# Patient Record
Sex: Female | Born: 1965 | Race: White | Hispanic: No | Marital: Married | Smoking: Never smoker
Health system: Southern US, Community
[De-identification: ages and names within clinical notes are randomized; demographics above are authoritative.]

## PROBLEM LIST (undated history)

## (undated) DIAGNOSIS — R51 Headache: Secondary | ICD-10-CM

## (undated) DIAGNOSIS — D229 Melanocytic nevi, unspecified: Secondary | ICD-10-CM

## (undated) DIAGNOSIS — K5190 Ulcerative colitis, unspecified, without complications: Secondary | ICD-10-CM

## (undated) DIAGNOSIS — D649 Anemia, unspecified: Secondary | ICD-10-CM

## (undated) DIAGNOSIS — R519 Headache, unspecified: Secondary | ICD-10-CM

## (undated) DIAGNOSIS — K219 Gastro-esophageal reflux disease without esophagitis: Secondary | ICD-10-CM

## (undated) DIAGNOSIS — F419 Anxiety disorder, unspecified: Secondary | ICD-10-CM

## (undated) DIAGNOSIS — Z87442 Personal history of urinary calculi: Secondary | ICD-10-CM

## (undated) HISTORY — DX: Gastro-esophageal reflux disease without esophagitis: K21.9

## (undated) HISTORY — PX: OTHER SURGICAL HISTORY: SHX169

## (undated) HISTORY — PX: ABDOMINAL HYSTERECTOMY: SHX81

---
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
## 1998-04-13 ENCOUNTER — Other Ambulatory Visit: Admission: RE | Admit: 1998-04-13 | Discharge: 1998-04-13 | Payer: Self-pay | Admitting: Obstetrics and Gynecology

## 1998-10-05 ENCOUNTER — Other Ambulatory Visit: Admission: RE | Admit: 1998-10-05 | Discharge: 1998-10-05 | Payer: Self-pay | Admitting: Obstetrics and Gynecology

## 1999-04-19 ENCOUNTER — Other Ambulatory Visit: Admission: RE | Admit: 1999-04-19 | Discharge: 1999-04-19 | Payer: Self-pay | Admitting: Obstetrics and Gynecology

## 1999-11-21 ENCOUNTER — Other Ambulatory Visit: Admission: RE | Admit: 1999-11-21 | Discharge: 1999-11-21 | Payer: Self-pay | Admitting: Obstetrics and Gynecology

## 1999-12-12 ENCOUNTER — Encounter: Payer: Self-pay | Admitting: Obstetrics and Gynecology

## 1999-12-14 ENCOUNTER — Ambulatory Visit (HOSPITAL_COMMUNITY): Admission: RE | Admit: 1999-12-14 | Discharge: 1999-12-14 | Payer: Self-pay | Admitting: Obstetrics and Gynecology

## 2000-05-30 ENCOUNTER — Other Ambulatory Visit: Admission: RE | Admit: 2000-05-30 | Discharge: 2000-05-30 | Payer: Self-pay | Admitting: Obstetrics and Gynecology

## 2000-11-13 ENCOUNTER — Other Ambulatory Visit: Admission: RE | Admit: 2000-11-13 | Discharge: 2000-11-13 | Payer: Self-pay | Admitting: Obstetrics and Gynecology

## 2001-05-29 ENCOUNTER — Other Ambulatory Visit: Admission: RE | Admit: 2001-05-29 | Discharge: 2001-05-29 | Payer: Self-pay | Admitting: Obstetrics and Gynecology

## 2001-12-25 ENCOUNTER — Other Ambulatory Visit: Admission: RE | Admit: 2001-12-25 | Discharge: 2001-12-25 | Payer: Self-pay | Admitting: Obstetrics and Gynecology

## 2002-09-09 ENCOUNTER — Other Ambulatory Visit: Admission: RE | Admit: 2002-09-09 | Discharge: 2002-09-09 | Payer: Self-pay | Admitting: Obstetrics and Gynecology

## 2003-04-08 ENCOUNTER — Other Ambulatory Visit: Admission: RE | Admit: 2003-04-08 | Discharge: 2003-04-08 | Payer: Self-pay | Admitting: Obstetrics and Gynecology

## 2003-05-31 ENCOUNTER — Emergency Department (HOSPITAL_COMMUNITY): Admission: EM | Admit: 2003-05-31 | Discharge: 2003-06-01 | Payer: Self-pay | Admitting: Emergency Medicine

## 2003-06-01 ENCOUNTER — Encounter: Payer: Self-pay | Admitting: Emergency Medicine

## 2003-12-31 ENCOUNTER — Other Ambulatory Visit: Admission: RE | Admit: 2003-12-31 | Discharge: 2003-12-31 | Payer: Self-pay | Admitting: Obstetrics and Gynecology

## 2004-11-17 ENCOUNTER — Other Ambulatory Visit: Admission: RE | Admit: 2004-11-17 | Discharge: 2004-11-17 | Payer: Self-pay | Admitting: Obstetrics and Gynecology

## 2005-08-28 ENCOUNTER — Other Ambulatory Visit: Admission: RE | Admit: 2005-08-28 | Discharge: 2005-08-28 | Payer: Self-pay | Admitting: Obstetrics and Gynecology

## 2006-07-04 ENCOUNTER — Encounter (INDEPENDENT_AMBULATORY_CARE_PROVIDER_SITE_OTHER): Payer: Self-pay | Admitting: *Deleted

## 2006-07-04 ENCOUNTER — Inpatient Hospital Stay (HOSPITAL_COMMUNITY): Admission: RE | Admit: 2006-07-04 | Discharge: 2006-07-07 | Payer: Self-pay | Admitting: Obstetrics and Gynecology

## 2008-10-22 ENCOUNTER — Emergency Department (HOSPITAL_COMMUNITY): Admission: EM | Admit: 2008-10-22 | Discharge: 2008-10-22 | Payer: Self-pay | Admitting: Emergency Medicine

## 2010-03-21 ENCOUNTER — Encounter: Admission: RE | Admit: 2010-03-21 | Discharge: 2010-03-21 | Payer: Self-pay | Admitting: Family Medicine

## 2010-03-29 ENCOUNTER — Encounter: Admission: RE | Admit: 2010-03-29 | Discharge: 2010-03-29 | Payer: Self-pay | Admitting: Gastroenterology

## 2010-10-08 ENCOUNTER — Encounter: Payer: Self-pay | Admitting: Gastroenterology

## 2011-01-02 LAB — CBC
MCHC: 33.6 g/dL (ref 30.0–36.0)
MCV: 79.9 fL (ref 78.0–100.0)
Platelets: 297 10*3/uL (ref 150–400)
RBC: 4.5 MIL/uL (ref 3.87–5.11)
RDW: 14.6 % (ref 11.5–15.5)

## 2011-01-02 LAB — POCT PREGNANCY, URINE: Preg Test, Ur: NEGATIVE

## 2011-01-02 LAB — DIFFERENTIAL
Basophils Absolute: 0 10*3/uL (ref 0.0–0.1)
Basophils Relative: 0 % (ref 0–1)
Eosinophils Absolute: 0 10*3/uL (ref 0.0–0.7)
Neutro Abs: 8.4 10*3/uL — ABNORMAL HIGH (ref 1.7–7.7)
Neutrophils Relative %: 88 % — ABNORMAL HIGH (ref 43–77)

## 2011-01-02 LAB — URINE CULTURE: Colony Count: NO GROWTH

## 2011-01-02 LAB — URINALYSIS, ROUTINE W REFLEX MICROSCOPIC
Glucose, UA: NEGATIVE mg/dL
Specific Gravity, Urine: 1.036 — ABNORMAL HIGH (ref 1.005–1.030)
Urobilinogen, UA: 1 mg/dL (ref 0.0–1.0)

## 2011-01-02 LAB — HEPATIC FUNCTION PANEL
ALT: 11 U/L (ref 0–35)
AST: 22 U/L (ref 0–37)
Alkaline Phosphatase: 58 U/L (ref 39–117)
Bilirubin, Direct: 0.1 mg/dL (ref 0.0–0.3)
Indirect Bilirubin: 0.5 mg/dL (ref 0.3–0.9)
Total Bilirubin: 0.6 mg/dL (ref 0.3–1.2)

## 2011-01-02 LAB — POCT I-STAT, CHEM 8
Chloride: 104 mEq/L (ref 96–112)
Glucose, Bld: 145 mg/dL — ABNORMAL HIGH (ref 70–99)
HCT: 40 % (ref 36.0–46.0)
Hemoglobin: 13.6 g/dL (ref 12.0–15.0)
Potassium: 3.7 mEq/L (ref 3.5–5.1)
Sodium: 140 mEq/L (ref 135–145)

## 2011-01-02 LAB — URINE MICROSCOPIC-ADD ON

## 2011-01-12 ENCOUNTER — Other Ambulatory Visit: Payer: Self-pay | Admitting: Internal Medicine

## 2011-01-12 DIAGNOSIS — E042 Nontoxic multinodular goiter: Secondary | ICD-10-CM

## 2011-01-17 ENCOUNTER — Ambulatory Visit
Admission: RE | Admit: 2011-01-17 | Discharge: 2011-01-17 | Disposition: A | Discharge status: Home / Self Care | Payer: BC Managed Care – PPO | Source: Ambulatory Visit | Attending: Internal Medicine | Admitting: Internal Medicine

## 2011-01-17 DIAGNOSIS — E042 Nontoxic multinodular goiter: Secondary | ICD-10-CM

## 2011-02-02 NOTE — Discharge Summary (Signed)
NAMESTEPHANIEANN, Kaitlin Parker              ACCOUNT NO.:  [id number]   MEDICAL RECORD NO.:  [id number]          PATIENT TYPE:  INP   LOCATION:  9310                          FACILITY:  [facility]   PHYSICIAN:  Miguel Aschoff, M.D.       DATE OF BIRTH:  [birth year]   DATE OF ADMISSION:  07/04/2006  DATE OF DISCHARGE:  07/07/2006                                 DISCHARGE SUMMARY   PREOPERATIVE DIAGNOSES:  1. Symptomatic uterine fibroids.  2. Menorrhagia.  3. Anemia.   POSTOPERATIVE DIAGNOSES:  1. Symptomatic uterine fibroids.  2. Menorrhagia.  3. Anemia.   OPERATIONS AND PROCEDURES:  Total abdominal hysterectomy.   BRIEF HISTORY:  The patient is a 45 year old white female with a history of  progressively heavier menses and who was noted on examination to have the  sudden onset of enlargement of the uterus with an exam approximately 18  months ago with a normal-sized uterus, now with the uterus being  approximately 14 weeks' size.  Due to the sudden onset of increased size of  the uterus associated with uterine fibroids, menorrhagia and anemia of 9.6,  the patient was admitted to the hospital to undergo a total abdominal  hysterectomy.  The risks and benefits of the procedure were discussed with  the patient.   HOSPITAL COURSE:  Preoperative studies were obtained.  The patient's initial  hemoglobin was 9.6.  Chemistry profile essentially within normal limits.  Urinalysis was negative.   Under general anesthesia, on July 04, 2006, a total abdominal  hysterectomy was carried out without difficulty.  Findings at the time of  surgery were limited to significant and diffuse enlargement of the uterus.  The tubes and ovaries appeared to be within normal limits.  The patient did  have a prior history of endometriosis.  No endometriosis was noted at the  time of the laparotomy.   Her abdominal exam under anesthesia intraoperatively was negative.  Hysterectomy was carried out without difficulty.  The  only complication  following surgery was a drop in her hemoglobin to 6.9.  the patient remained  stable and did not have any signs or symptoms of symptomatic anemia.  By the  third postoperative day was able to be discharged home.   MEDICATIONS FOR HOME:  1. Tylox one every 3 hours as needed for pain.  2. Niferex 150 iron tablets one or two daily.  3. The laxative of her choice.   She was instructed no heavy lifting, place nothing in the vagina, to call if  there were any problems such as fever, pain or heavy bleeding.  The patient  will be seen back in 4 weeks for a follow-up examination.  The final  pathology report revealed presence of a 9 cm benign leiomyoma of the uterus.  No other abnormalities were noted.      Miguel Aschoff, M.D.  Electronically Signed     AR/MEDQ  D:  07/14/2006  T:  07/15/2006  Job:  [id number]

## 2011-02-02 NOTE — Op Note (Signed)
Kaitlin Parker, Kaitlin Parker              ACCOUNT NO.:  [id number]   MEDICAL RECORD NO.:  [id number]          PATIENT TYPE:  INP   LOCATION:  9310                          FACILITY:  [facility]   PHYSICIAN:  Miguel Aschoff, M.D.       DATE OF BIRTH:  17-Feb-1966   DATE OF PROCEDURE:  07/04/2006  DATE OF DISCHARGE:                                 OPERATIVE REPORT   PREOPERATIVE DIAGNOSIS:  Uterine fibroids, menorrhagia.   POSTOPERATIVE DIAGNOSIS:  Uterine fibroids, menorrhagia.   PROCEDURE:  Total abdominal hysterectomy.   SURGEON:  Miguel Aschoff, M.D.   ASSISTANT:  Freddrick March. Tenny Craw, M.D.   ANESTHESIA:  General.   COMPLICATIONS:  None.   JUSTIFICATION:  The patient is a 45 year old white female with onset of  heavy menstrual cycles who, on examination, has been noted to have  significant enlargement of her uterus since her prior examination with her  uterus now being 14 weeks' equivalent in size.  Due to the degree of  bleeding and size of the uterine fibroids, she presents now to undergo  definitive therapy via total abdominal hysterectomy.  The patient also has a  history of endometriosis and if significant endometriosis is found, the  patient is to undergo bilateral salpingo-oophorectomy.  The risks and  benefits of the procedures were discussed with the patient.   DESCRIPTION OF PROCEDURE:  The patient was taken to the operating and placed  in the supine position.  General anesthesia was administered without  difficulty.  She was then placed in the supine position, prepped and draped  in the usual sterile fashion.  A Foley catheter was inserted. Once this was  done, a Pfannenstiel incision was made, extended down through the  subcutaneous tissue with bleeding points being clamped and coagulated as  they were encountered.  The fascia was then identified and incised  transversely. It was then separated from the underlying rectus muscles.  The  rectus muscles were divided in the midline.  The  peritoneum was then found  and entered carefully avoiding underlying structures.  The peritoneal  incision was then extended under direct visualization.  At this point, a  self-retaining retractor was placed through the wound.  The viscera were  packed out of the pelvis.  The uterus, again, was found to be globular,  approximately 14-16 weeks' equivalent in size.  It seemed the bulk of the  uterus was involved with one large myoma.  The tubes and ovaries appeared to  be within normal limits.  No abnormalities were noted on the cul-de-sac.  The appendix was visualized, was retrocecal, and appeared to be within  normal limits.   At this point, the uterus was elevated.  The utero-ovarian ligaments were  grasped with Kelly clamps.  At this point, the round ligaments were  identified, ligated, and cut using suture ligatures of 0 Vicryl.  The  bladder flap was then created anteriorly.  Perforations were then made below  the utero-ovarian ligament.  This ligament was then clamped, cut with curved  Heaney clamps, and the pedicles doubly ligated using suture ligatures of 0  Vicryl. The broad ligament was then skeletonized. The bladder flap created  anteriorly.  The uterine vessels were identified, clamped, cut and suture  ligated using suture ligatures of 0 Vicryl.  Because of the large size of  the uterine fundus and its ability to impair further dissection into the  pelvis, it was elected to remove the fundus and this was done without  difficulty.   At this point, attention was directed to the residual lower uterine segment  and cervix. Serial bites were taken using straight Heaney clamps of the  paracervical fascia.  These pedicles were cut and suture ligated using  suture ligatures of 0 Vicryl.  The uterosacral ligaments were then found,  clamped, cut and suture ligated using suture ligatures of 0 Vicryl.  The  cardinal ligaments were clamped, cut, and suture ligated in a similar  fashion.   At this point, it was possible to crossclamp across the reflection  of the cervix on the vagina and the specimen was excised.  The specimen now  consisted of the lower uterine segment and cervix.  It was removed en toto.  The angles of the vaginal cuff were ligated using the free sutures of 0  Vicryl.  The uterosacral ligaments were incorporated for support.  Then, the  vaginal cuff was closed using running interlocking 0 Vicryl suture.   At this point, inspection was made for hemostasis.  Hemostasis appeared to  be excellent. At this point, the pelvic floor was reperitonealized using  running continuous 2-0 Vicryl suture.  At this point, lap counts and  instrument counts were taken and noted to be correct.  Then, the abdomen was  closed.  The parietal peritoneum was closed using running continuous 0  Vicryl suture.  The rectus muscles were reapproximated using running  continuous 0 Vicryl suture.  The fascia was closed using two sutures of 0  Vicryl, each starting at the lateral fascial angles and meeting in the  midline.  The subcutaneous tissues were closed using interrupted 0 Vicryl  sutures and the skin incision was closed using staples.  The estimated blood  loss from the procedure was approximately 150 mL.  The patient tolerated the  procedure well and went to the recovery room in satisfactory condition.      Miguel Aschoff, M.D.  Electronically Signed     AR/MEDQ  D:  07/04/2006  T:  07/05/2006  Job:  [id number]

## 2011-02-02 NOTE — Op Note (Signed)
[facility]  Patient:    Kaitlin Parker, Kaitlin Parker                     MRN: [id number] Proc. Date: 12/14/99 Adm. Date:  [id number] Attending:  Miguel Aschoff                           Operative Report  PREOPERATIVE DIAGNOSIS:       Pelvic pain, secondary dysmenorrhea.  POSTOPERATIVE DIAGNOSIS:      Pelvic endometriosis.  OPERATION:                    Laser laparoscopy with laser uterosacral nerve ablation and laser ablation of endometrial implants.  SURGEON:                      Miguel Aschoff, M.D.  ASSISTANT:  ANESTHESIA:                   General anesthesia.  COMPLICATIONS:                None.  ESTIMATED BLOOD LOSS:  INDICATIONS:                  The patient is a 45 year old white female with a history of progressive pelvic pain and secondary dysmenorrhea and dyspareunia.  Outpatient evaluation has been negative and she presents now to undergo laparoscopy to see if an etiology for the pain can be established and corrected.  The risks and benefits of this procedure have been discussed with the patient and informed consent has been obtained.  DESCRIPTION OF PROCEDURE:     The patient was taken to the operating room and placed in the supine position and general anesthesia was administered without difficulty.  She was then placed in the dorsal lithotomy position and prepped and draped in the usual sterile fashion.  The bladder was catheterized and then a Hulka tenaculum was placed through the cervix and held.  After this was done, attention was directed to the umbilicus where a small infraumbilical incision was made, the Veress needle was inserted, and the abdomen was insufflated with 3 liters of CO2 under monitored pressure.  Then a trocar to laparoscope was placed.  The laparoscope was then placed through the trocar sheath and inspection of the pelvic organs was carried out.  To allow better visualization, the 5 mm suprapubic port was  established under direct visualization.  The uterus appeared to be anterior and normal size and shape.  The anterior bladder peritoneum was unremarkable.  The round ligaments were unremarkable.  There was no evidence of any inguinal hernias present.  The appendix was visualized and was noted to be within normal limits s was the cecum.  The tubes were traced along their course.  The tubes were normal bilaterally.  The fimbria were fine and delicate.  Inspection of the right ovary showed the ovary to be free from the lateral pelvic side wall.  The ovary appeared to be normal size and shape, however, characteristic implants of endometriosis mm in size were noted on the surface of the right ovary.  These were also noted on the surface of the left ovary.  Inspection was then made in the cul-de-sac again characteristic of implants of endometriosis were noted between the uterosacral ligaments, deep in the cul-de-sac.  There was a peritoneal window present in this location.  There appeared to be thickening  of the right uterosacral ligament, significant thicker than the left and was again characteristic of implants of endometriosis.  At this point, the YAG laser was placed through the operating channel of the laparoscope and the GRP6 tip at 15 watts of power, all implants ere laser ablated without difficulty.  Then the uterosacral ligaments were partially transected in an effort to denovate the uterus and decrease the patients pelvic  pain and dysmenorrhea.  After this was done, a final inspection was made for hemostasis.  Hemostasis appeared to be excellent.  With no other abnormalities being noted, the procedure was completed.  The CO2 was allowed to escape.  All instruments were removed and the small incisions were closed using subcuticular 4-0 Vicryl.  The port sites were injected with 0.25% Marcaine.  The patient was taken to the recovery room in satisfactory condition.  The plan  is for the patient to be seen back in four weeks for follow-up examination.  She is to call for any problems such as fever, pain, or heavy bleeding.  Medications for home include Tylox one every three hours as needed for pain.  She is instructed to have no sex for approximately one week. DD:  12/14/99 TD:  12/14/99 Job: [id number]

## 2011-03-19 ENCOUNTER — Other Ambulatory Visit: Payer: Self-pay | Admitting: Gastroenterology

## 2012-03-04 ENCOUNTER — Other Ambulatory Visit: Payer: Self-pay | Admitting: Internal Medicine

## 2012-03-04 DIAGNOSIS — E049 Nontoxic goiter, unspecified: Secondary | ICD-10-CM

## 2012-03-05 ENCOUNTER — Ambulatory Visit
Admission: RE | Admit: 2012-03-05 | Discharge: 2012-03-05 | Disposition: A | Discharge status: Home / Self Care | Payer: BC Managed Care – PPO | Source: Ambulatory Visit | Attending: Internal Medicine | Admitting: Internal Medicine

## 2012-03-05 DIAGNOSIS — E049 Nontoxic goiter, unspecified: Secondary | ICD-10-CM

## 2012-09-29 ENCOUNTER — Other Ambulatory Visit: Payer: Self-pay | Admitting: Surgery

## 2013-02-04 ENCOUNTER — Other Ambulatory Visit: Payer: Self-pay | Admitting: Internal Medicine

## 2013-02-06 ENCOUNTER — Ambulatory Visit
Admission: RE | Admit: 2013-02-06 | Discharge: 2013-02-06 | Disposition: A | Discharge status: Home / Self Care | Payer: BC Managed Care – PPO | Source: Ambulatory Visit | Attending: Internal Medicine | Admitting: Internal Medicine

## 2013-04-02 ENCOUNTER — Other Ambulatory Visit: Payer: Self-pay | Admitting: Obstetrics and Gynecology

## 2013-06-22 ENCOUNTER — Other Ambulatory Visit: Payer: Self-pay | Admitting: Gastroenterology

## 2013-06-23 ENCOUNTER — Encounter (HOSPITAL_COMMUNITY): Payer: Self-pay

## 2013-06-23 ENCOUNTER — Encounter (HOSPITAL_COMMUNITY)
Admission: RE | Disposition: A | Discharge status: Home / Self Care | Payer: Self-pay | Source: Ambulatory Visit | Attending: Gastroenterology

## 2013-06-23 ENCOUNTER — Other Ambulatory Visit: Payer: Self-pay | Admitting: Gastroenterology

## 2013-06-23 ENCOUNTER — Ambulatory Visit (HOSPITAL_COMMUNITY)
Admission: RE | Admit: 2013-06-23 | Discharge: 2013-06-23 | Disposition: A | Discharge status: Home / Self Care | Payer: BC Managed Care – PPO | Source: Ambulatory Visit | Attending: Gastroenterology | Admitting: Gastroenterology

## 2013-06-23 DIAGNOSIS — K5100 Ulcerative (chronic) pancolitis without complications: Secondary | ICD-10-CM | POA: Insufficient documentation

## 2013-06-23 DIAGNOSIS — K921 Melena: Secondary | ICD-10-CM | POA: Insufficient documentation

## 2013-06-23 DIAGNOSIS — Z79899 Other long term (current) drug therapy: Secondary | ICD-10-CM | POA: Insufficient documentation

## 2013-06-23 DIAGNOSIS — R197 Diarrhea, unspecified: Secondary | ICD-10-CM | POA: Insufficient documentation

## 2013-06-23 DIAGNOSIS — D509 Iron deficiency anemia, unspecified: Secondary | ICD-10-CM | POA: Insufficient documentation

## 2013-06-23 HISTORY — DX: Anxiety disorder, unspecified: F41.9

## 2013-06-23 HISTORY — DX: Ulcerative colitis, unspecified, without complications: K51.90

## 2013-06-23 HISTORY — PX: FLEXIBLE SIGMOIDOSCOPY: SHX5431

## 2013-06-23 SURGERY — SIGMOIDOSCOPY, FLEXIBLE

## 2013-06-23 SURGERY — SIGMOIDOSCOPY, FLEXIBLE
Anesthesia: Moderate Sedation

## 2013-06-23 MED ORDER — FLEET ENEMA 7-19 GM/118ML RE ENEM
ENEMA | RECTAL | Status: AC
  Filled 2013-06-23: qty 1

## 2013-06-23 MED ORDER — SODIUM CHLORIDE 0.9 % IV SOLN: INTRAVENOUS | Status: DC

## 2013-06-23 NOTE — Progress Notes (Signed)
Fleet enema given as ordered with patient on her left side.  Tolerated very well with deep breathing.  Holding at present for five minutes. No bleeding when expelling enema.  Mainly yellow fluid return.

## 2013-06-23 NOTE — H&P (Signed)
  Problem: Diarrhea and hematochezia. Iron deficiency anemia. Universal ulcerative colitis diagnosed in 1998.  History: The patient is a 47 year old female born 02-04-1966. She was diagnosed with universal ulcerative colitis in 1998. In 2012, she underwent a normal surveillance colonoscopy with random colon biopsies.  The patient recently completed a tapering dose of prednisone to treat bloody diarrhea. She chronically takes mesalamine 1600 mg daily.  The patient has developed recurrent bloody diarrhea. Her mesalamine dose was increased to 3200 mg daily.  On 06/18/2013, her white blood cell count was 5300, hemoglobin 8.5 g, and serum iron saturation 4%.  The patient is scheduled to undergo a diagnostic flexible proctosigmoidoscopy.  Past medical history: Universal ulcerative colitis diagnosed in 1998. Irritable bowel syndrome. Left colonic diverticulosis. Gastroesophageal reflux. Multinodular goiter. Generalized anxiety disorder. Total abdominal hysterectomy for fibroids.  Allergies: Keflex causes rash. Erythromycin causes rash. Sulfa drugs cause chronic swelling.  Exam: The patient is alert and lying comfortably on the endoscopy stretcher. Abdomen is soft and nontender to palpation. Lungs clear to auscultation. Cardiac exam reveals a regular rhythm.  Plan: Proceed a diagnostic flexible proctosigmoidoscopy.

## 2013-06-23 NOTE — Op Note (Signed)
Problem: Diarrhea and hematochezia. Universal ulcerative colitis diagnosed in 1998.  Endoscopist: Danise Edge  Premedication: None  Procedure: Diagnostic flexible proctosigmoidoscopy The patient was placed in the left lateral decubitus position. Anal inspection and digital rectal exam were normal. The Pentax pediatric colonoscope was introduced into the rectum and advanced to approximately 50 cm from the anal verge. 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 No aphthous ulcers were present. No endoscopic signs of Clostridium difficile colitis.  Recommendations: Continue mesalamine 3200 mg daily. Start prednisone 40 mg each morning.

## 2013-06-24 ENCOUNTER — Encounter (HOSPITAL_COMMUNITY): Payer: Self-pay | Admitting: Gastroenterology

## 2013-08-12 ENCOUNTER — Other Ambulatory Visit: Payer: Self-pay | Admitting: Gastroenterology

## 2013-11-01 IMAGING — US US PELVIS COMPLETE
1 series · 13 of 25 positions shown · non-contrast
Comparison: None

CLINICAL DATA: Low midline pelvic pain.  Post hysterectomy for
fibroids.

TRANSABDOMINAL AND TRANSVAGINAL ULTRASOUND OF PELVIS
TECHNIQUE: Both transabdominal and transvaginal ultrasound
examinations of the pelvis were performed. Transabdominal technique
was performed for global imaging of the pelvis including vaginal
cuff, ovaries, adnexal regions, and pelvic cul-de-sac.
It was necessary to proceed with endovaginal exam following the
transabdominal exam to visualize the vaginal cuff and adnexa.

[Series 1: us pelvis complete · 0.26mm/px · 13 of 43 slices shown]
[im 1/43]
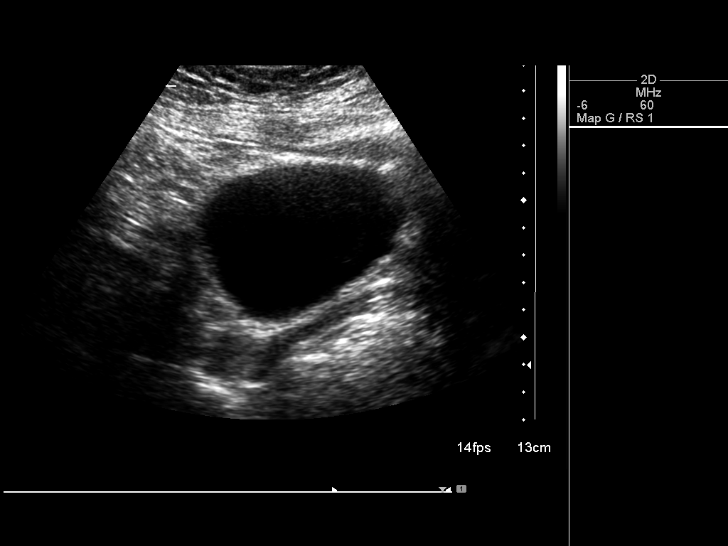
[im 4/43]
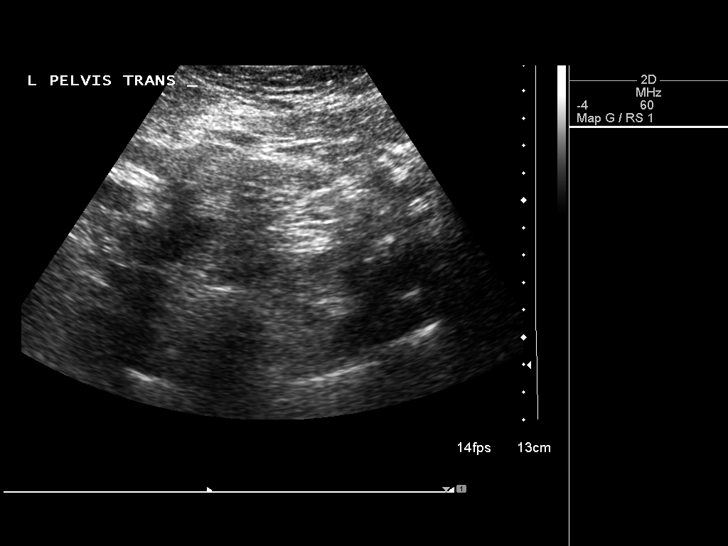
[im 8/43]
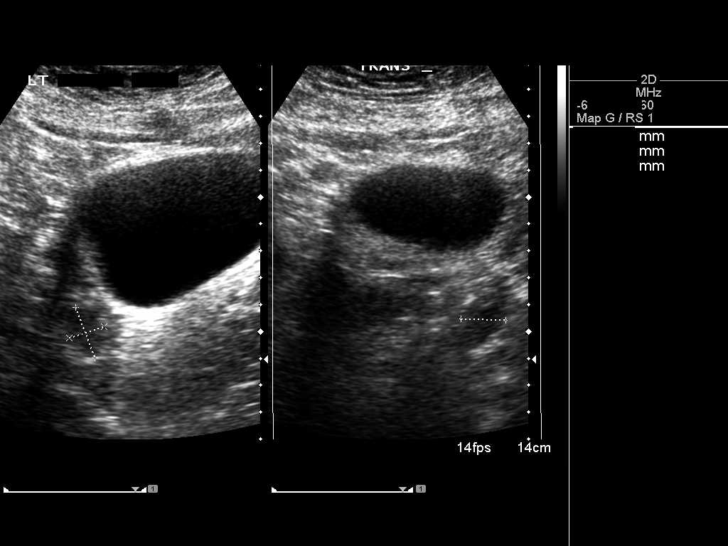
[im 11/43]
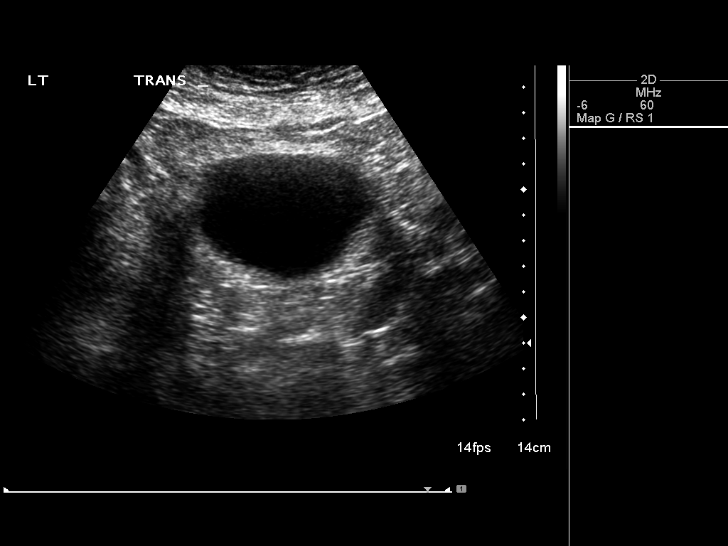
[im 15/43]
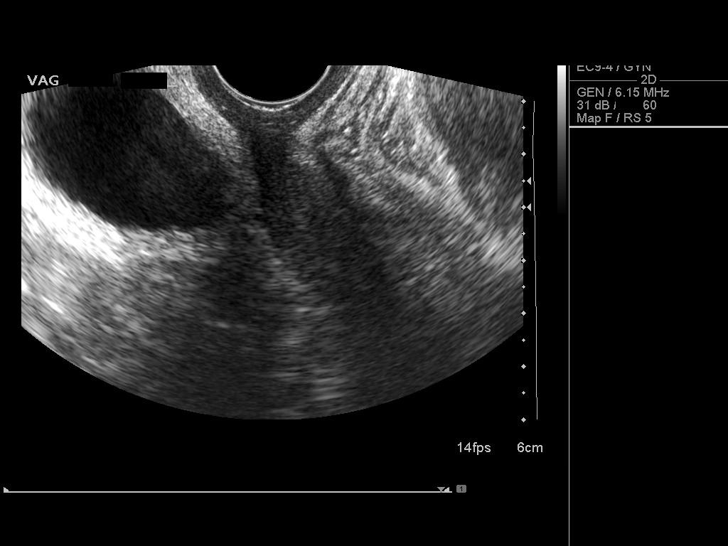
[im 18/43]
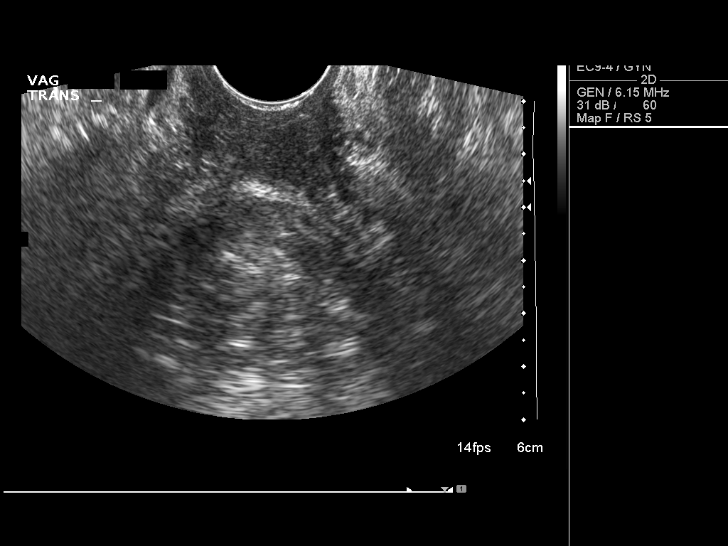
[im 22/43]
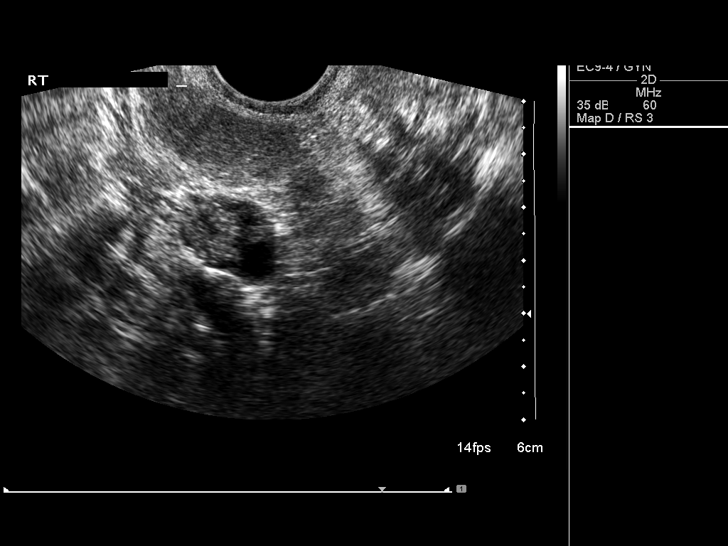
[im 25/43]
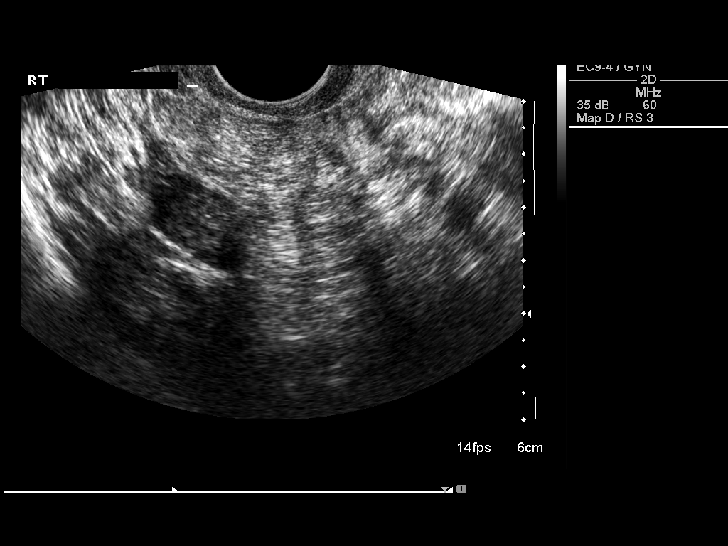
[im 29/43]
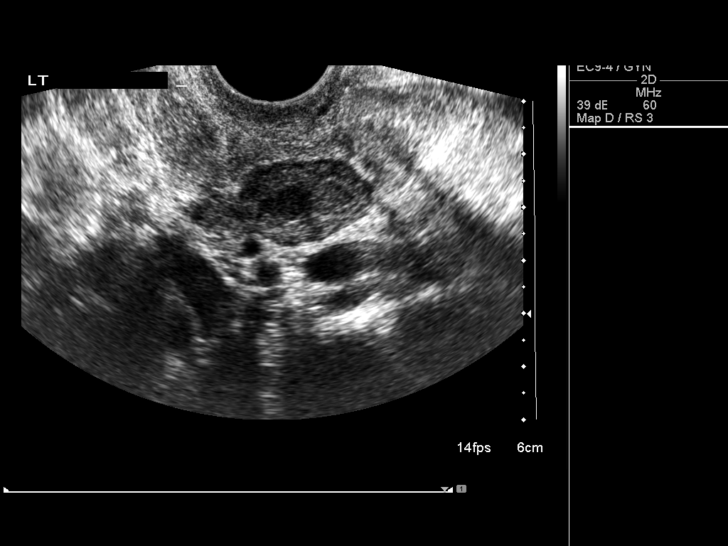
[im 32/43]
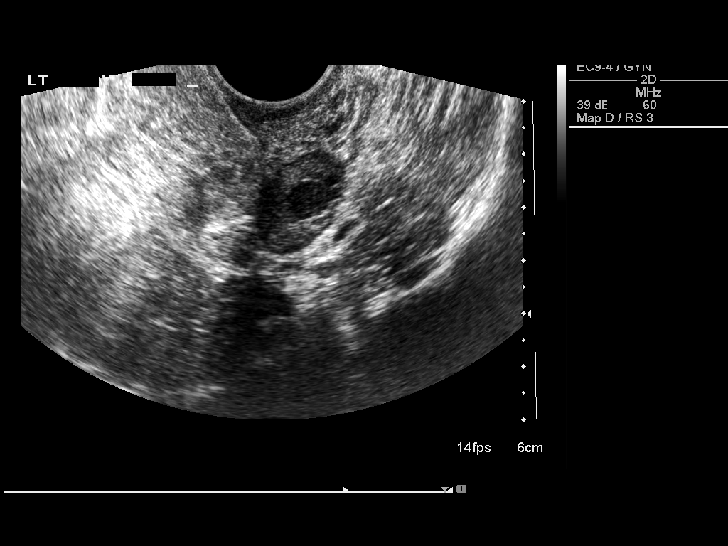
[im 36/43]
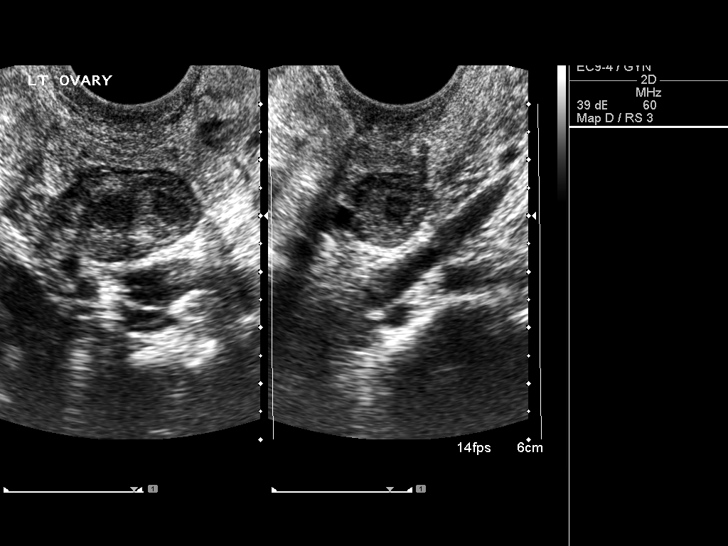
[im 39/43]
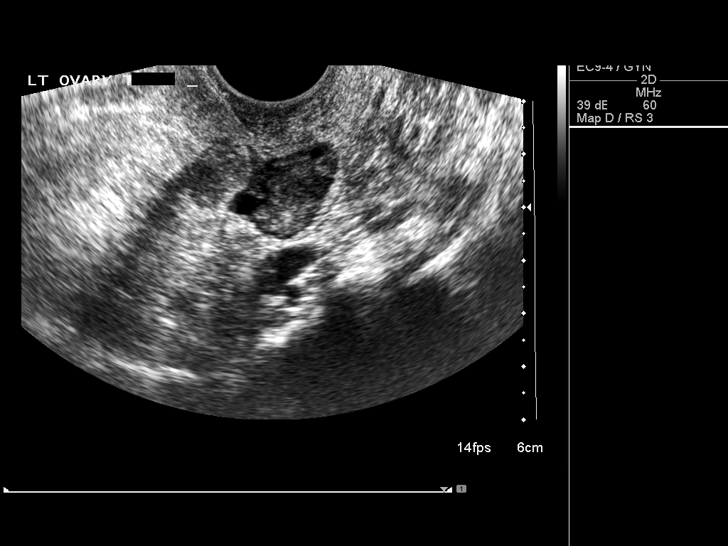
[im 43/43]
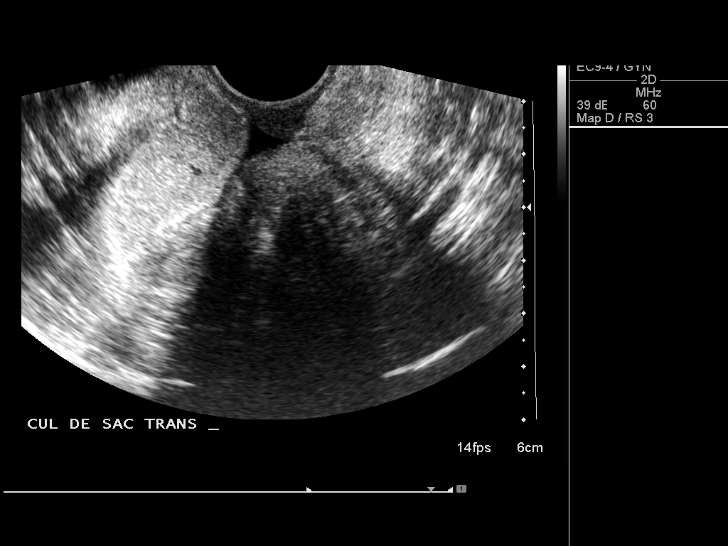

[13 of 25 positions shown; findings below may reference images not displayed]

FINDINGS: Uterus: Has been surgically removed.  A normal vaginal cuff is
identified

Endometrium: Not applicable

Right ovary:  Measures 2.4 x 1.5 x 1.9 cm and has a normal
appearance

Left ovary: Measures 2.7 x 1.6 x 1.7 cm and contains a corpus
luteum.  A small thin-walled complex cyst is seen measuring 1 cm in
size and containing diffuse low level echoes.  This may represent
either a small hemorrhagic cyst or endometrioma.

Other findings: A trace of simple free fluid is noted in the cul-de-
sac. No separate adnexal masses are seen.
IMPRESSION: Normal vaginal cuff.

Small complex left ovarian cyst likely representing either a small
endometrioma or hemorrhagic cyst. Follow-up evaluation can be
performed in 8 weeks for reassessment.  If this finding remains
stable an endometrioma is most likely, while interval change would
indicate a hemorrhagic cyst.

Normal right ovary.

## 2014-04-09 ENCOUNTER — Other Ambulatory Visit: Payer: Self-pay | Admitting: Obstetrics and Gynecology

## 2014-04-12 LAB — CYTOLOGY - PAP

## 2015-05-26 ENCOUNTER — Other Ambulatory Visit: Payer: Self-pay

## 2015-06-24 ENCOUNTER — Other Ambulatory Visit: Payer: Self-pay | Admitting: Obstetrics and Gynecology

## 2015-06-27 LAB — CYTOLOGY - PAP

## 2016-04-10 ENCOUNTER — Other Ambulatory Visit: Payer: Self-pay | Admitting: Gastroenterology

## 2016-04-16 ENCOUNTER — Encounter (HOSPITAL_COMMUNITY): Admission: RE | Payer: Self-pay | Source: Ambulatory Visit

## 2016-04-16 ENCOUNTER — Ambulatory Visit (HOSPITAL_COMMUNITY)
Admission: RE | Admit: 2016-04-16 | Payer: BC Managed Care – PPO | Source: Ambulatory Visit | Admitting: Gastroenterology

## 2016-04-16 SURGERY — COLONOSCOPY WITH PROPOFOL
Anesthesia: Monitor Anesthesia Care

## 2016-04-26 ENCOUNTER — Ambulatory Visit (INDEPENDENT_AMBULATORY_CARE_PROVIDER_SITE_OTHER): Payer: BC Managed Care – PPO | Admitting: Podiatry

## 2016-04-26 ENCOUNTER — Encounter: Payer: Self-pay | Admitting: Podiatry

## 2016-04-26 ENCOUNTER — Ambulatory Visit (INDEPENDENT_AMBULATORY_CARE_PROVIDER_SITE_OTHER): Payer: BC Managed Care – PPO

## 2016-04-26 VITALS — BP 148/95 | HR 86 | Resp 16 | Ht 67.0 in | Wt 200.0 lb

## 2016-04-26 DIAGNOSIS — M779 Enthesopathy, unspecified: Secondary | ICD-10-CM

## 2016-04-26 DIAGNOSIS — M79672 Pain in left foot: Secondary | ICD-10-CM | POA: Diagnosis not present

## 2016-04-26 MED ORDER — TRIAMCINOLONE ACETONIDE 10 MG/ML IJ SUSP
10.0000 mg | Freq: Once | INTRAMUSCULAR | Status: AC
  Administered 2016-04-26: 10 mg

## 2016-04-26 NOTE — Progress Notes (Signed)
   Subjective:    Patient ID: Kaitlin Parker, female    DOB: 1966/04/20, 50 y.o.   MRN: [id number]  HPI Chief Complaint  Patient presents with  . Foot Pain    Left foot; 2nd toe; & plantar forefoot; pt stated, "pain radiates from second toe to the top of the foot"; x2 months  . Callouses    Bilateral; plantar forefoot; x3 years      Review of Systems  All other systems reviewed and are negative.      Objective:   Physical Exam        Assessment & Plan:

## 2016-04-27 NOTE — Progress Notes (Signed)
Subjective:     Patient ID: Kaitlin Parker, female   DOB: 01-03-1966, 50 y.o.   MRN: [id number]  HPI patient presents stating that joint is really starting to bother her again and making it hard to wear shoe gear comfortably or walk   Review of Systems     Objective:   Physical Exam Neurovascular status intact muscle strength adequate with inflammatory changes second metatarsophalangeal joint left with fluid buildup within the joint and pain when palpated    Assessment:     Inflammatory capsulitis second MPJ left with persistent problems and mild movement of the toe    Plan:     Discussed that ultimately this may require flexor plate repair along with digital stabilization and osteotomy but we are to continue to work with conservatively and I did a proximal nerve block aspirated the joint getting out a small amount of clear fluid and injected quarter cc dexamethasone Kenalog and applied thick plantar padding

## 2016-05-02 ENCOUNTER — Other Ambulatory Visit: Payer: Self-pay | Admitting: Internal Medicine

## 2016-05-02 DIAGNOSIS — E041 Nontoxic single thyroid nodule: Secondary | ICD-10-CM

## 2016-05-04 ENCOUNTER — Ambulatory Visit
Admission: RE | Admit: 2016-05-04 | Discharge: 2016-05-04 | Disposition: A | Discharge status: Home / Self Care | Payer: BC Managed Care – PPO | Source: Ambulatory Visit | Attending: Internal Medicine | Admitting: Internal Medicine

## 2016-05-04 DIAGNOSIS — E041 Nontoxic single thyroid nodule: Secondary | ICD-10-CM

## 2016-05-10 ENCOUNTER — Ambulatory Visit: Payer: BC Managed Care – PPO | Admitting: Podiatry

## 2016-07-17 ENCOUNTER — Other Ambulatory Visit: Payer: Self-pay | Admitting: Gastroenterology

## 2016-07-23 ENCOUNTER — Other Ambulatory Visit: Payer: Self-pay | Admitting: Gastroenterology

## 2016-08-22 ENCOUNTER — Encounter (HOSPITAL_COMMUNITY): Payer: Self-pay | Admitting: *Deleted

## 2016-08-28 ENCOUNTER — Encounter (HOSPITAL_COMMUNITY)
Admission: RE | Disposition: A | Discharge status: Home / Self Care | Payer: Self-pay | Source: Ambulatory Visit | Attending: Gastroenterology

## 2016-08-28 ENCOUNTER — Ambulatory Visit (HOSPITAL_COMMUNITY): Payer: BC Managed Care – PPO | Admitting: Certified Registered Nurse Anesthetist

## 2016-08-28 ENCOUNTER — Encounter (HOSPITAL_COMMUNITY): Payer: Self-pay | Admitting: *Deleted

## 2016-08-28 ENCOUNTER — Ambulatory Visit (HOSPITAL_COMMUNITY)
Admission: RE | Admit: 2016-08-28 | Discharge: 2016-08-28 | Disposition: A | Discharge status: Home / Self Care | Payer: BC Managed Care – PPO | Source: Ambulatory Visit | Attending: Gastroenterology | Admitting: Gastroenterology

## 2016-08-28 DIAGNOSIS — K219 Gastro-esophageal reflux disease without esophagitis: Secondary | ICD-10-CM | POA: Insufficient documentation

## 2016-08-28 DIAGNOSIS — Z882 Allergy status to sulfonamides status: Secondary | ICD-10-CM | POA: Diagnosis not present

## 2016-08-28 DIAGNOSIS — F411 Generalized anxiety disorder: Secondary | ICD-10-CM | POA: Insufficient documentation

## 2016-08-28 DIAGNOSIS — K5100 Ulcerative (chronic) pancolitis without complications: Secondary | ICD-10-CM | POA: Diagnosis not present

## 2016-08-28 DIAGNOSIS — K589 Irritable bowel syndrome without diarrhea: Secondary | ICD-10-CM | POA: Diagnosis not present

## 2016-08-28 DIAGNOSIS — Z1211 Encounter for screening for malignant neoplasm of colon: Secondary | ICD-10-CM | POA: Diagnosis present

## 2016-08-28 HISTORY — PX: COLONOSCOPY WITH PROPOFOL: SHX5780

## 2016-08-28 HISTORY — PX: ESOPHAGOGASTRODUODENOSCOPY (EGD) WITH PROPOFOL: SHX5813

## 2016-08-28 HISTORY — DX: Anemia, unspecified: D64.9

## 2016-08-28 HISTORY — DX: Headache, unspecified: R51.9

## 2016-08-28 HISTORY — DX: Personal history of urinary calculi: Z87.442

## 2016-08-28 HISTORY — DX: Headache: R51

## 2016-08-28 SURGERY — COLONOSCOPY WITH PROPOFOL
Anesthesia: Monitor Anesthesia Care

## 2016-08-28 MED ORDER — PROPOFOL 10 MG/ML IV BOLUS
INTRAVENOUS | Status: DC | PRN
  Administered 2016-08-28 (×2): 20 mg via INTRAVENOUS

## 2016-08-28 MED ORDER — PROPOFOL 10 MG/ML IV BOLUS
INTRAVENOUS | Status: AC
  Filled 2016-08-28: qty 20

## 2016-08-28 MED ORDER — ONDANSETRON HCL 4 MG/2ML IJ SOLN
INTRAMUSCULAR | Status: AC
  Filled 2016-08-28: qty 2

## 2016-08-28 MED ORDER — PROPOFOL 500 MG/50ML IV EMUL
INTRAVENOUS | Status: DC | PRN
  Administered 2016-08-28: 175 ug/kg/min via INTRAVENOUS

## 2016-08-28 MED ORDER — LIDOCAINE 2% (20 MG/ML) 5 ML SYRINGE
INTRAMUSCULAR | Status: AC
  Filled 2016-08-28: qty 5

## 2016-08-28 MED ORDER — SODIUM CHLORIDE 0.9 % IV SOLN: INTRAVENOUS | Status: DC

## 2016-08-28 MED ORDER — ONDANSETRON HCL 4 MG/2ML IJ SOLN
INTRAMUSCULAR | Status: DC | PRN
  Administered 2016-08-28: 4 mg via INTRAVENOUS

## 2016-08-28 MED ORDER — LACTATED RINGERS IV SOLN
INTRAVENOUS | Status: DC
  Administered 2016-08-28: 1000 mL via INTRAVENOUS

## 2016-08-28 MED ORDER — PROPOFOL 10 MG/ML IV BOLUS
INTRAVENOUS | Status: AC
  Filled 2016-08-28: qty 40

## 2016-08-28 MED ORDER — LIDOCAINE 2% (20 MG/ML) 5 ML SYRINGE
INTRAMUSCULAR | Status: DC | PRN
  Administered 2016-08-28: 100 mg via INTRAVENOUS

## 2016-08-28 SURGICAL SUPPLY — 25 items

## 2016-08-28 NOTE — Discharge Instructions (Signed)

## 2016-08-28 NOTE — Anesthesia Preprocedure Evaluation (Signed)
Anesthesia Evaluation  Patient identified by MRN, date of birth, ID band Patient awake    Reviewed: Allergy & Precautions, NPO status , Patient's Chart, lab work & pertinent test results  Airway Mallampati: II  TM Distance: >3 FB Neck ROM: Full    Dental no notable dental hx.    Pulmonary neg pulmonary ROS,    Pulmonary exam normal breath sounds clear to auscultation       Cardiovascular negative cardio ROS Normal cardiovascular exam Rhythm:Regular Rate:Normal     Neuro/Psych negative neurological ROS  negative psych ROS   GI/Hepatic negative GI ROS, Neg liver ROS,   Endo/Other  negative endocrine ROS  Renal/GU negative Renal ROS  negative genitourinary   Musculoskeletal negative musculoskeletal ROS (+)   Abdominal   Peds negative pediatric ROS (+)  Hematology negative hematology ROS (+)   Anesthesia Other Findings   Reproductive/Obstetrics negative OB ROS                             Anesthesia Physical Anesthesia Plan  ASA: II  Anesthesia Plan: MAC   Post-op Pain Management:    Induction:   Airway Management Planned: Nasal Cannula  Additional Equipment:   Intra-op Plan:   Post-operative Plan:   Informed Consent: I have reviewed the patients History and Physical, chart, labs and discussed the procedure including the risks, benefits and alternatives for the proposed anesthesia with the patient or authorized representative who has indicated his/her understanding and acceptance.   Dental advisory given  Plan Discussed with: CRNA  Anesthesia Plan Comments:         Anesthesia Quick Evaluation

## 2016-08-28 NOTE — Anesthesia Procedure Notes (Signed)
Procedure Name: MAC Date/Time: 08/28/2016 1:51 PM Performed by: West Pugh Pre-anesthesia Checklist: Patient identified, Timeout performed, Emergency Drugs available, Suction available and Patient being monitored Patient Re-evaluated:Patient Re-evaluated prior to inductionOxygen Delivery Method: Nasal cannula Placement Confirmation: positive ETCO2 Dental Injury: Teeth and Oropharynx as per pre-operative assessment

## 2016-08-28 NOTE — Anesthesia Postprocedure Evaluation (Signed)
Anesthesia Post Note  Patient: Kaitlin Parker  Procedure(s) Performed: Procedure(s) (LRB): COLONOSCOPY WITH PROPOFOL (N/A) ESOPHAGOGASTRODUODENOSCOPY (EGD) WITH PROPOFOL (N/A)  Patient location during evaluation: Endoscopy Anesthesia Type: MAC Level of consciousness: awake and alert Pain management: pain level controlled Vital Signs Assessment: post-procedure vital signs reviewed and stable Respiratory status: spontaneous breathing, nonlabored ventilation, respiratory function stable and patient connected to nasal cannula oxygen Cardiovascular status: stable and blood pressure returned to baseline Anesthetic complications: no    Last Vitals:  Vitals:   08/28/16 1505 08/28/16 1510  BP:  127/83  Pulse: 76 74  Resp: 19 12  Temp:      Last Pain:  Vitals:   08/28/16 1441  TempSrc: Oral                 Montez Hageman

## 2016-08-28 NOTE — Op Note (Addendum)
[facility] Patient Name: Kaitlin Parker Procedure Date: 08/28/2016 MRN: [id number] Attending MD: Garlan Fair , MD Date of Birth: 12-21-1965 CSN: [id number] Age: 50 Admit Type: Outpatient Procedure:                Upper GI endoscopy Indications:              Heartburn Providers:                Garlan Fair, MD, Dustin Flock RN, RN, Corliss Parish, Technician Referring MD:              Medicines:                Propofol per Anesthesia Complications:            No immediate complications. Estimated Blood Loss:     Estimated blood loss: none. Procedure:                Pre-Anesthesia Assessment:                           - Prior to the procedure, a History and Physical                            was performed, and patient medications and                            allergies were reviewed. The patient's tolerance of                            previous anesthesia was also reviewed. The risks                            and benefits of the procedure and the sedation                            options and risks were discussed with the patient.                            All questions were answered, and informed consent                            was obtained. Prior Anticoagulants: The patient has                            taken no previous anticoagulant or antiplatelet                            agents. ASA Grade Assessment: II - A patient with                            mild systemic disease. After reviewing the risks  and benefits, the patient was deemed in                            satisfactory condition to undergo the procedure.                           After obtaining informed consent, the endoscope was                            passed under direct vision. Throughout the                            procedure, the patient's blood pressure, pulse, and                            oxygen saturations were  monitored continuously. The                            [id number]) scope was introduced through the                            mouth, and advanced to the second part of duodenum.                            The upper GI endoscopy was accomplished without                            difficulty. The patient tolerated the procedure                            well. Scope In: Scope Out: Findings:      The Z-line was regular and was found 40 cm from the incisors.      The examined esophagus was normal.      The entire examined stomach was normal.      The examined duodenum was normal. Impression:               - Z-line regular, 40 cm from the incisors.                           - Normal esophagus.                           - Normal stomach.                           - Normal examined duodenum.                           - No specimens collected. Moderate Sedation:      N/A- Per Anesthesia Care Recommendation:           - Patient has a contact number available for                            emergencies. The signs and symptoms of potential  delayed complications were discussed with the                            patient. Return to normal activities tomorrow.                            Written discharge instructions were provided to the                            patient.                           - Return to primary care physician PRN.                           - Resume previous diet.                           - Continue present medications. Procedure Code(s):        --- Professional ---                           [phone], Esophagogastroduodenoscopy, flexible,                            transoral; diagnostic, including collection of                            specimen(s) by brushing or washing, when performed                            (separate procedure) Diagnosis Code(s):        --- Professional ---                           R12, Heartburn CPT copyright 2016  American Medical Association. All rights reserved. The codes documented in this report are preliminary and upon coder review may  be revised to meet current compliance requirements. Earle Gell, MD Garlan Fair, MD 08/28/2016 2:34:11 PM This report has been signed electronically. Number of Addenda: 0

## 2016-08-28 NOTE — H&P (Signed)
Problem: Universal ulcerative colitis since 1998. 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 Surveillance mucosal biopsies did not show dysplasia. 03/19/2011 surveillance colonoscopy showed mild proctosigmoiditis and surveillance mucosal biopsies did not show dysplasia. Chronic gastroesophageal reflux.  History: The patient is a 50 year old female born 10/20/1965. She was diagnosed with Universal ulcerative colitis in 1998. She is scheduled to undergo surveillance colonoscopy today. She has chronic gastroesophageal reflux but has successfully tapered off omeprazole and is experiencing infrequent heartburn.  Past medical history: Universal ulcerative colitis diagnosed in 1998. Irritable bowel syndrome. Left colonic diverticulosis. Gastroesophageal reflux. Multinodular goiter. Generalized anxiety disorder. Total abdominal hysterectomy performed to treat fibroids.  Medication allergies: Keflex and erythromycin cause skin rash. Sulfa drugs cause eye swelling.  Exam: The patient is alert and lying comfortably on the endoscopy stretcher. Abdomen soft and nontender to palpation. Lungs are clear to auscultation. Cardiac exam reveals her rhythm.  Plan: Proceed with diagnostic esophagogastroduodenoscopy followed by surveillance colonoscopy.

## 2016-08-28 NOTE — Transfer of Care (Signed)
Immediate Anesthesia Transfer of Care Note  Patient: Kaitlin Parker  Procedure(s) Performed: Procedure(s) with comments: COLONOSCOPY WITH PROPOFOL (N/A) ESOPHAGOGASTRODUODENOSCOPY (EGD) WITH PROPOFOL (N/A) - Pt was examined by Dr Wynetta Emery for heartburn in 02/2016 and this procedure was discussed as "Possible" to have.  Dr Wynetta Emery will Re-Address at time of procedure.  Patient Location: PACU  Anesthesia Type:MAC  Level of Consciousness:  sedated, patient cooperative and responds to stimulation  Airway & Oxygen Therapy:Patient Spontanous Breathing and Patient connected to face mask oxgen  Post-op Assessment:  Report given to PACU RN and Post -op Vital signs reviewed and stable  Post vital signs:  Reviewed and stable  Last Vitals:  Vitals:   08/28/16 1258  BP: (!) 145/94  Pulse: 84  Resp: 19  Temp: [id number] C    Complications: No apparent anesthesia complications

## 2016-08-28 NOTE — Op Note (Addendum)
[facility] Patient Name: Kaitlin Parker Procedure Date: 08/28/2016 MRN: [id number] Attending MD: Garlan Fair , MD Date of Birth: 1965-11-18 CSN: [id number] Age: 50 Admit Type: Outpatient Procedure:                Colonoscopy Indications:              High risk colon cancer surveillance: Ulcerative                            pancolitis of 8 (or more) years duration Providers:                Garlan Fair, MD, Dustin Flock RN, RN, Corliss Parish, Technician Referring MD:              Medicines:                Propofol per Anesthesia Complications:            No immediate complications. Estimated Blood Loss:     Estimated blood loss: none. Procedure:                Pre-Anesthesia Assessment:                           - Prior to the procedure, a History and Physical                            was performed, and patient medications and                            allergies were reviewed. The patient's tolerance of                            previous anesthesia was also reviewed. The risks                            and benefits of the procedure and the sedation                            options and risks were discussed with the patient.                            All questions were answered, and informed consent                            was obtained. Prior Anticoagulants: The patient has                            taken no previous anticoagulant or antiplatelet                            agents. ASA Grade Assessment: II - A patient with  mild systemic disease. After reviewing the risks                            and benefits, the patient was deemed in                            satisfactory condition to undergo the procedure.                           After obtaining informed consent, the colonoscope                            was passed under direct vision. Throughout the   procedure, the patient's blood pressure, pulse, and                            oxygen saturations were monitored continuously. The                            [id number]) scope was introduced through                            the anus and advanced to the the cecum, identified                            by appendiceal orifice and ileocecal valve. The                            colonoscopy was performed without difficulty. The                            patient tolerated the procedure well. The quality                            of the bowel preparation was good. The terminal                            ileum, the ileocecal valve, the appendiceal orifice                            and the rectum were photographed. Scope In: 2:03:55 PM Scope Out: 2:29:41 PM Scope Withdrawal Time: 0 hours 19 minutes 9 seconds  Total Procedure Duration: 0 hours 25 minutes 46 seconds  Findings:      The perianal and digital rectal examinations were normal.      The entire examined colon appeared normal except for mild       proctosigmoiditis manifested by the loss in the mucosal vascular pattern       and a granular appearing but non-friable mucosa without ulcers. A total       of 32 surveillance mucosal biopsies were performed along the length of       the large intestine. Four quadrant biopsies were performed approximately       every 10 cm. No polyps were present. Impression:               -  The entire examined colon is normal.                           - No specimens collected. Moderate Sedation:      N/A- Per Anesthesia Care Recommendation:           - Patient has a contact number available for                            emergencies. The signs and symptoms of potential                            delayed complications were discussed with the                            patient. Return to normal activities tomorrow.                            Written discharge instructions were provided to the                             patient.                           - Repeat colonoscopy date to be determined after                            pending pathology results are reviewed for                            surveillance.                           - Resume previous diet.                           - Continue present medications. Procedure Code(s):        --- Professional ---                           [id number], Colorectal cancer screening; colonoscopy on                            individual at high risk Diagnosis Code(s):        --- Professional ---                           K51.00, Ulcerative (chronic) pancolitis without                            complications CPT copyright 2016 American Medical Association. All rights reserved. The codes documented in this report are preliminary and upon coder review may  be revised to meet current compliance requirements. Earle Gell, MD Garlan Fair, MD 08/28/2016 2:42:20 PM This report has been signed electronically. Number of Addenda: 0

## 2016-08-30 ENCOUNTER — Encounter (HOSPITAL_COMMUNITY): Payer: Self-pay | Admitting: Gastroenterology

## 2017-01-02 ENCOUNTER — Ambulatory Visit (INDEPENDENT_AMBULATORY_CARE_PROVIDER_SITE_OTHER): Payer: BC Managed Care – PPO | Admitting: Podiatry

## 2017-01-02 ENCOUNTER — Encounter: Payer: Self-pay | Admitting: Podiatry

## 2017-01-02 VITALS — Ht 67.0 in | Wt 200.0 lb

## 2017-01-02 DIAGNOSIS — B351 Tinea unguium: Secondary | ICD-10-CM

## 2017-01-02 NOTE — Progress Notes (Signed)
Subjective:     Patient ID: Kaitlin Parker, female   DOB: 1966/07/27, 51 y.o.   MRN: [id number]  HPI patient presents stating she is developed increased problems with her nail with her left one being a problem for several years and her right one being more recent period she states that they don't hurt but they are not looking good and they're thick   Review of Systems  All other systems reviewed and are negative.      Objective:   Physical Exam Neurovascular status found to be intact with muscle strength adequate range of motion within normal limits. Patient's noted to have thick nailbeds left and right hallux with dystrophic changes and discoloration. 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 She is going to have a pulse Lamisil therapy if she has a history of colitis along with laser treatment but wants to wait till the fall. She will start higher quality nail polish at this time and is scheduled mid September for laser therapy

## 2017-01-03 ENCOUNTER — Other Ambulatory Visit: Payer: Self-pay

## 2017-01-03 DIAGNOSIS — D229 Melanocytic nevi, unspecified: Secondary | ICD-10-CM

## 2017-01-03 HISTORY — DX: Melanocytic nevi, unspecified: D22.9

## 2017-01-27 IMAGING — US US THYROID
1 series · 13 of 25 positions shown · non-contrast
Comparison: 03/05/2012 and previous

CLINICAL DATA: Nodule

EXAM:
THYROID ULTRASOUND
TECHNIQUE: Ultrasound examination of the thyroid gland and adjacent soft
tissues was performed.

[Series 1: us thyroid · 0.06mm/px · 13 of 66 slices shown]
[im 1/66]
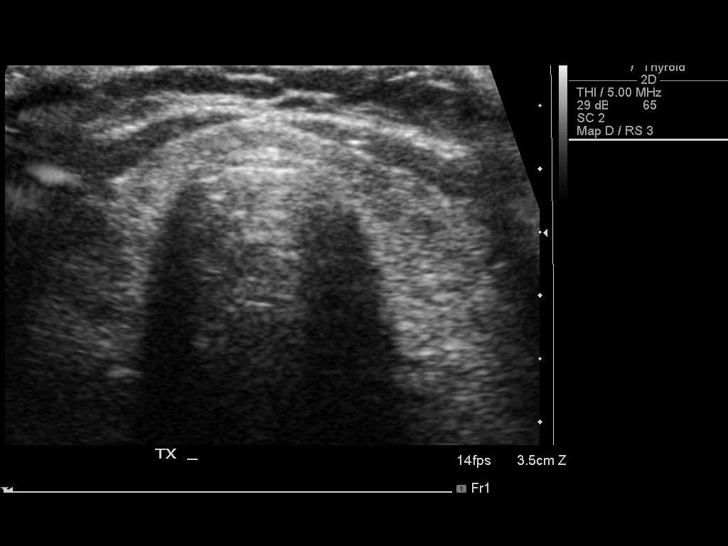
[im 6/66]
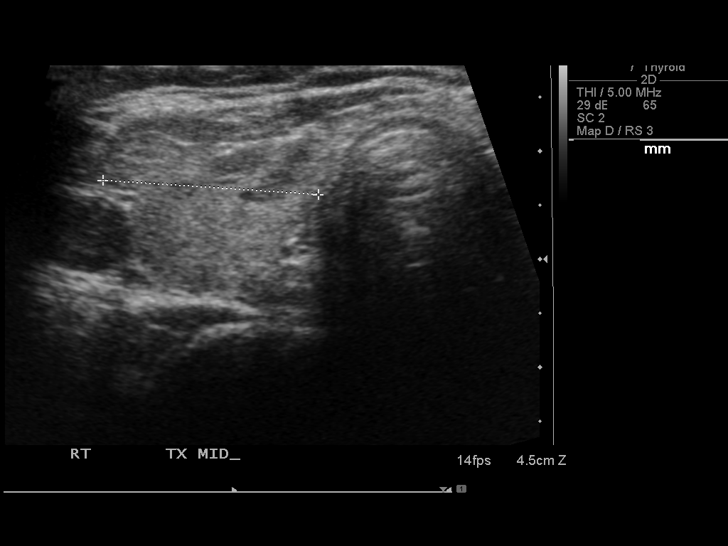
[im 11/66]
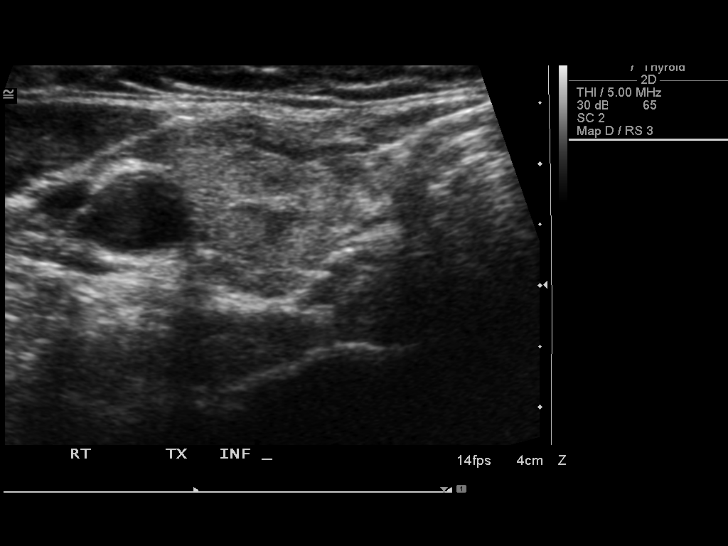
[im 17/66]
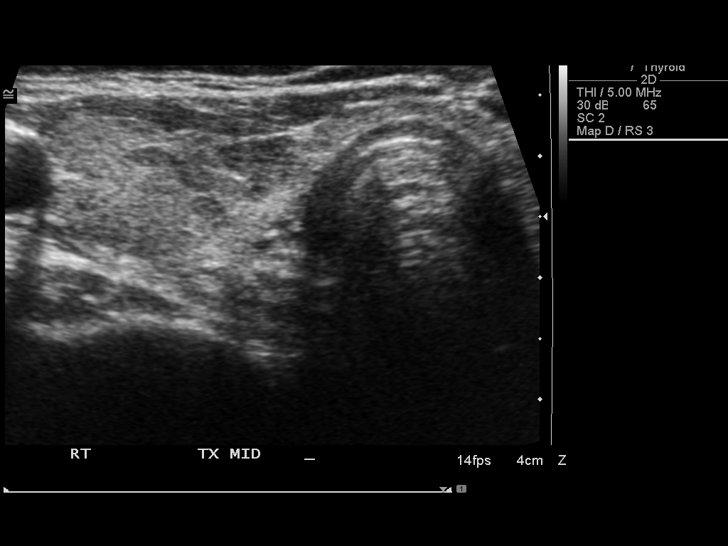
[im 22/66]
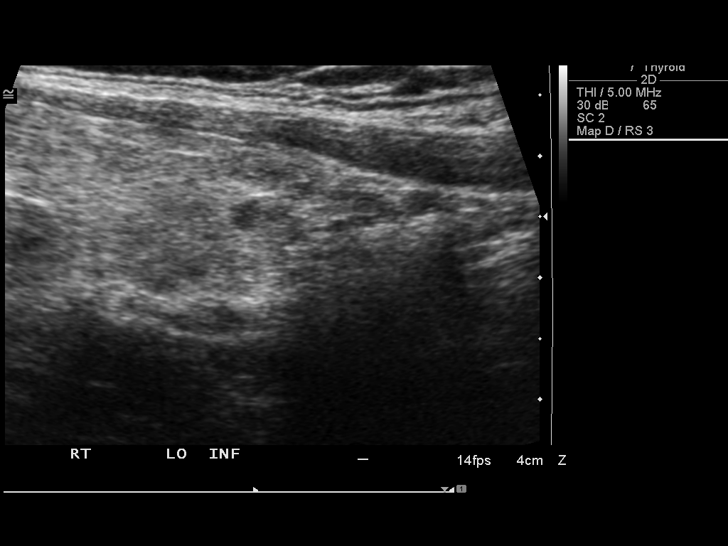
[im 28/66]
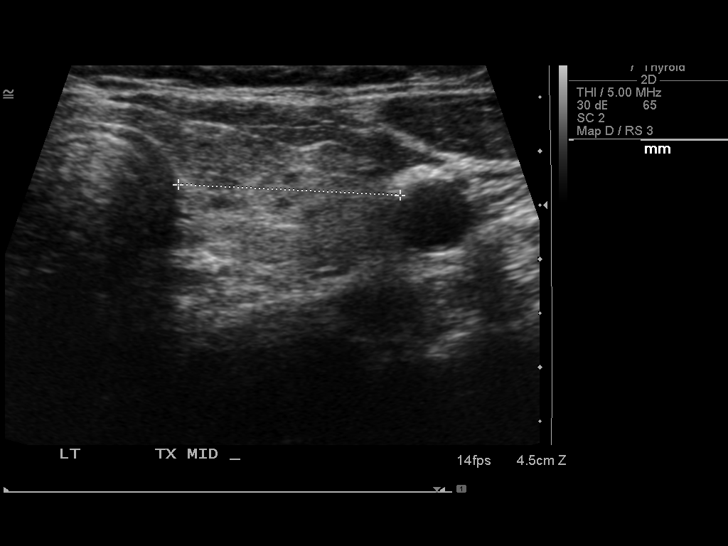
[im 33/66]
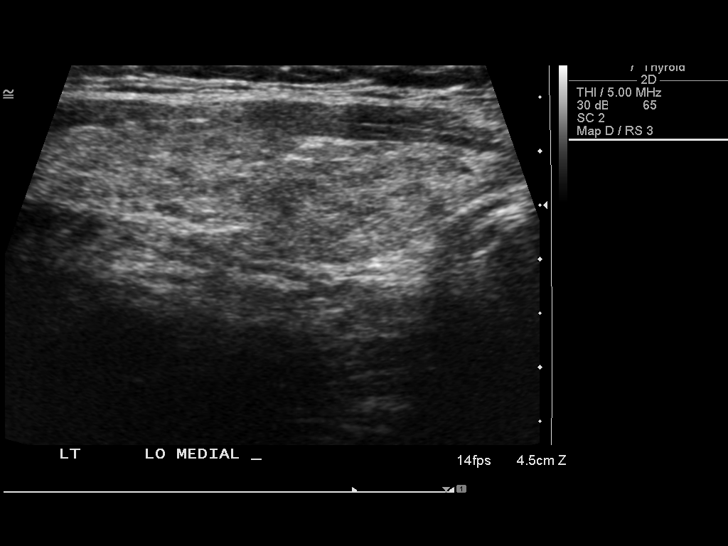
[im 38/66]
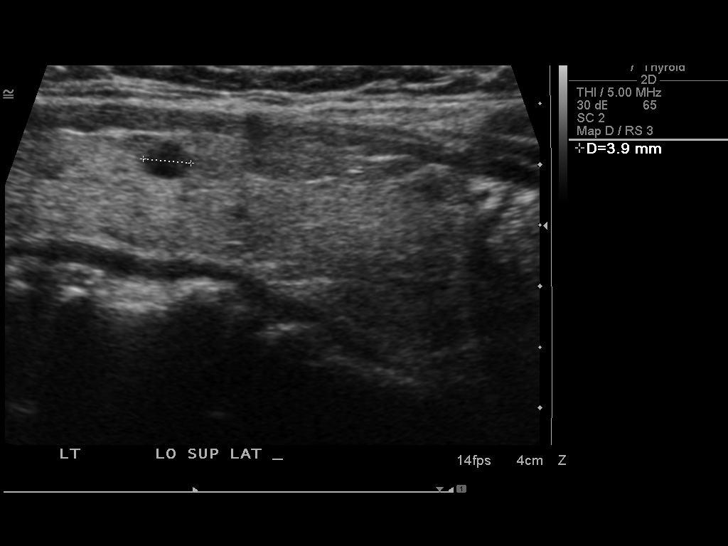
[im 44/66]
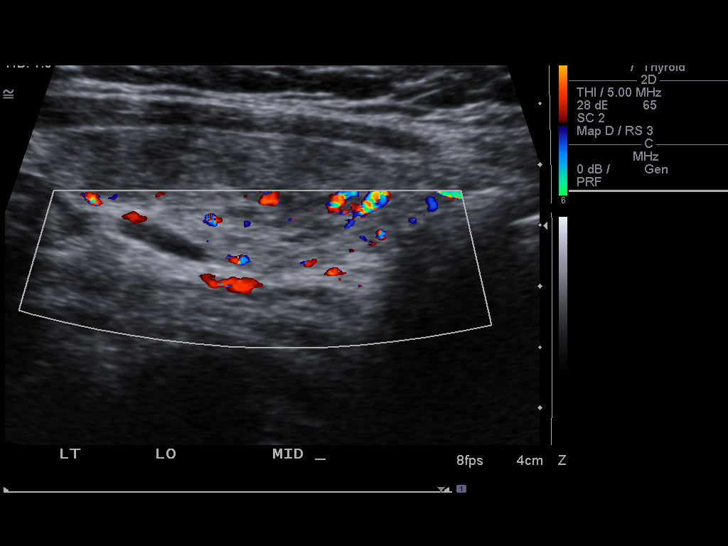
[im 49/66]
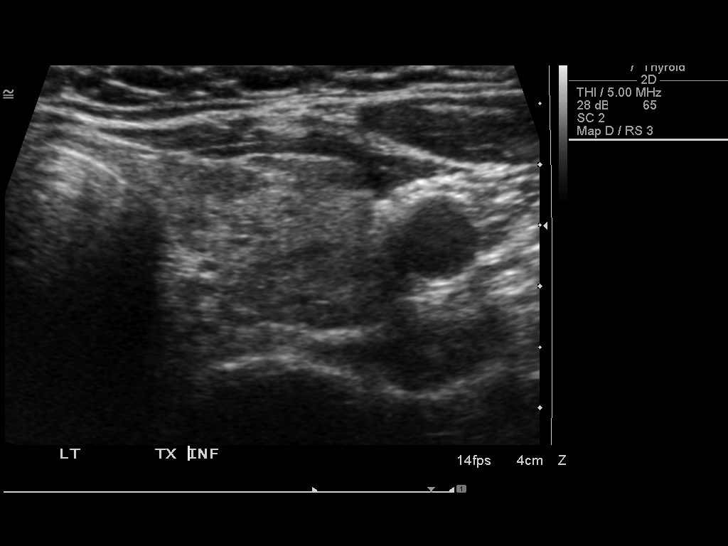
[im 55/66]
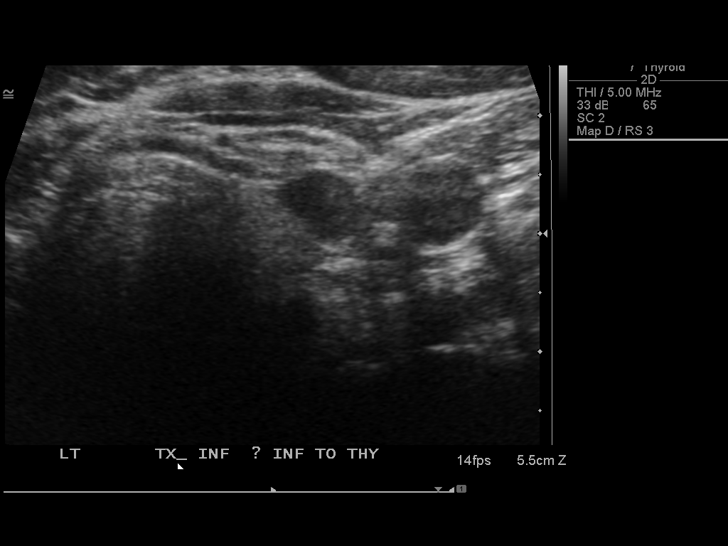
[im 60/66]
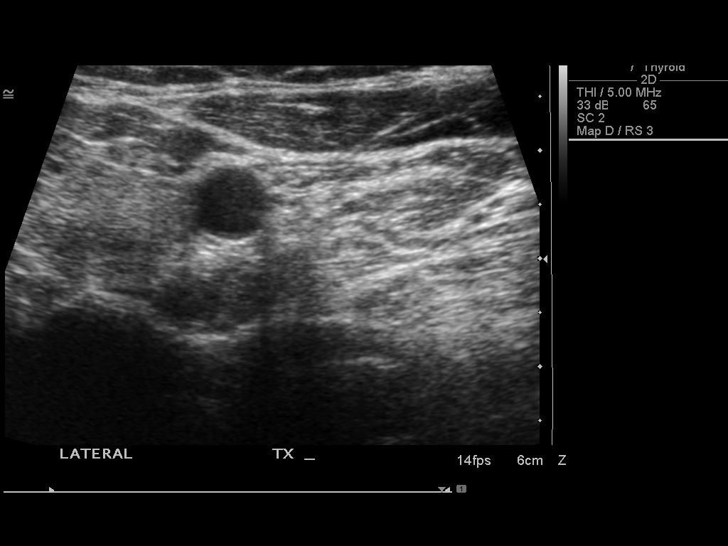
[im 66/66]
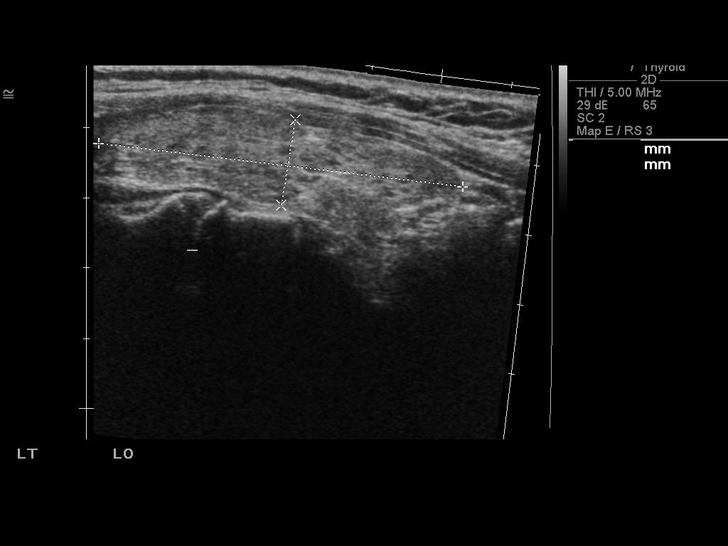

[13 of 25 positions shown; findings below may reference images not displayed]

FINDINGS: Right thyroid lobe

Measurements: 5.5 x 1.3 x 2 cm. Somewhat inhomogeneous background
echotexture. 1.2 x 0.6 x 0.9 cm solid nodule, deep mid lobe
(Previously 1.3 x 0.7 x 1.2). Additional smaller nodules less than 5
mm.

Left thyroid lobe

Measurements: 5.2 x 1.2 x 2.1 cm. 1.4 x 0.7 x 1.3 cm solid nodule,
deep mid lobe (previously 1.5 x 0.8 x 1.2). Additional smaller
nodules and cyst less than 5 mm.

Isthmus

Thickness: 0.2 cm.  No nodules visualized.

Lymphadenopathy

None visualized.
IMPRESSION: 1. Mild thyromegaly with stable small bilateral nodules. Findings do
not meet current consensus criteria for biopsy. Follow-up by
clinical exam is recommended. If patient has known risk factors for
thyroid carcinoma, consider follow-up ultrasound in 12 months. If
patient is clinically hyperthyroid, consider nuclear medicine
thyroid uptake and scan. This recommendation follows the consensus
statement: Management of Thyroid Nodules Detected as US: Society of
Radiologists in Ultrasound Consensus Conference Statement. Radiology

## 2017-06-05 ENCOUNTER — Ambulatory Visit: Payer: BC Managed Care – PPO | Admitting: Podiatry

## 2019-02-12 ENCOUNTER — Other Ambulatory Visit: Payer: Self-pay

## 2019-03-06 ENCOUNTER — Other Ambulatory Visit: Payer: Self-pay | Admitting: Internal Medicine

## 2019-03-06 ENCOUNTER — Ambulatory Visit
Admission: RE | Admit: 2019-03-06 | Discharge: 2019-03-06 | Disposition: A | Discharge status: Home / Self Care | Payer: BC Managed Care – PPO | Source: Ambulatory Visit | Attending: Internal Medicine | Admitting: Internal Medicine

## 2019-03-06 DIAGNOSIS — R079 Chest pain, unspecified: Secondary | ICD-10-CM

## 2019-09-17 ENCOUNTER — Ambulatory Visit: Payer: BC Managed Care – PPO | Attending: Internal Medicine

## 2019-09-17 DIAGNOSIS — U071 COVID-19: Secondary | ICD-10-CM

## 2019-09-18 LAB — NOVEL CORONAVIRUS, NAA: SARS-CoV-2, NAA: DETECTED — AB

## 2019-11-29 IMAGING — CR CHEST - 2 VIEW
2 series · 2 of 2 positions shown · non-contrast
Comparison: None.

CLINICAL DATA: Left-sided chest pain.

EXAM:
CHEST - 2 VIEW

[w chest pa]
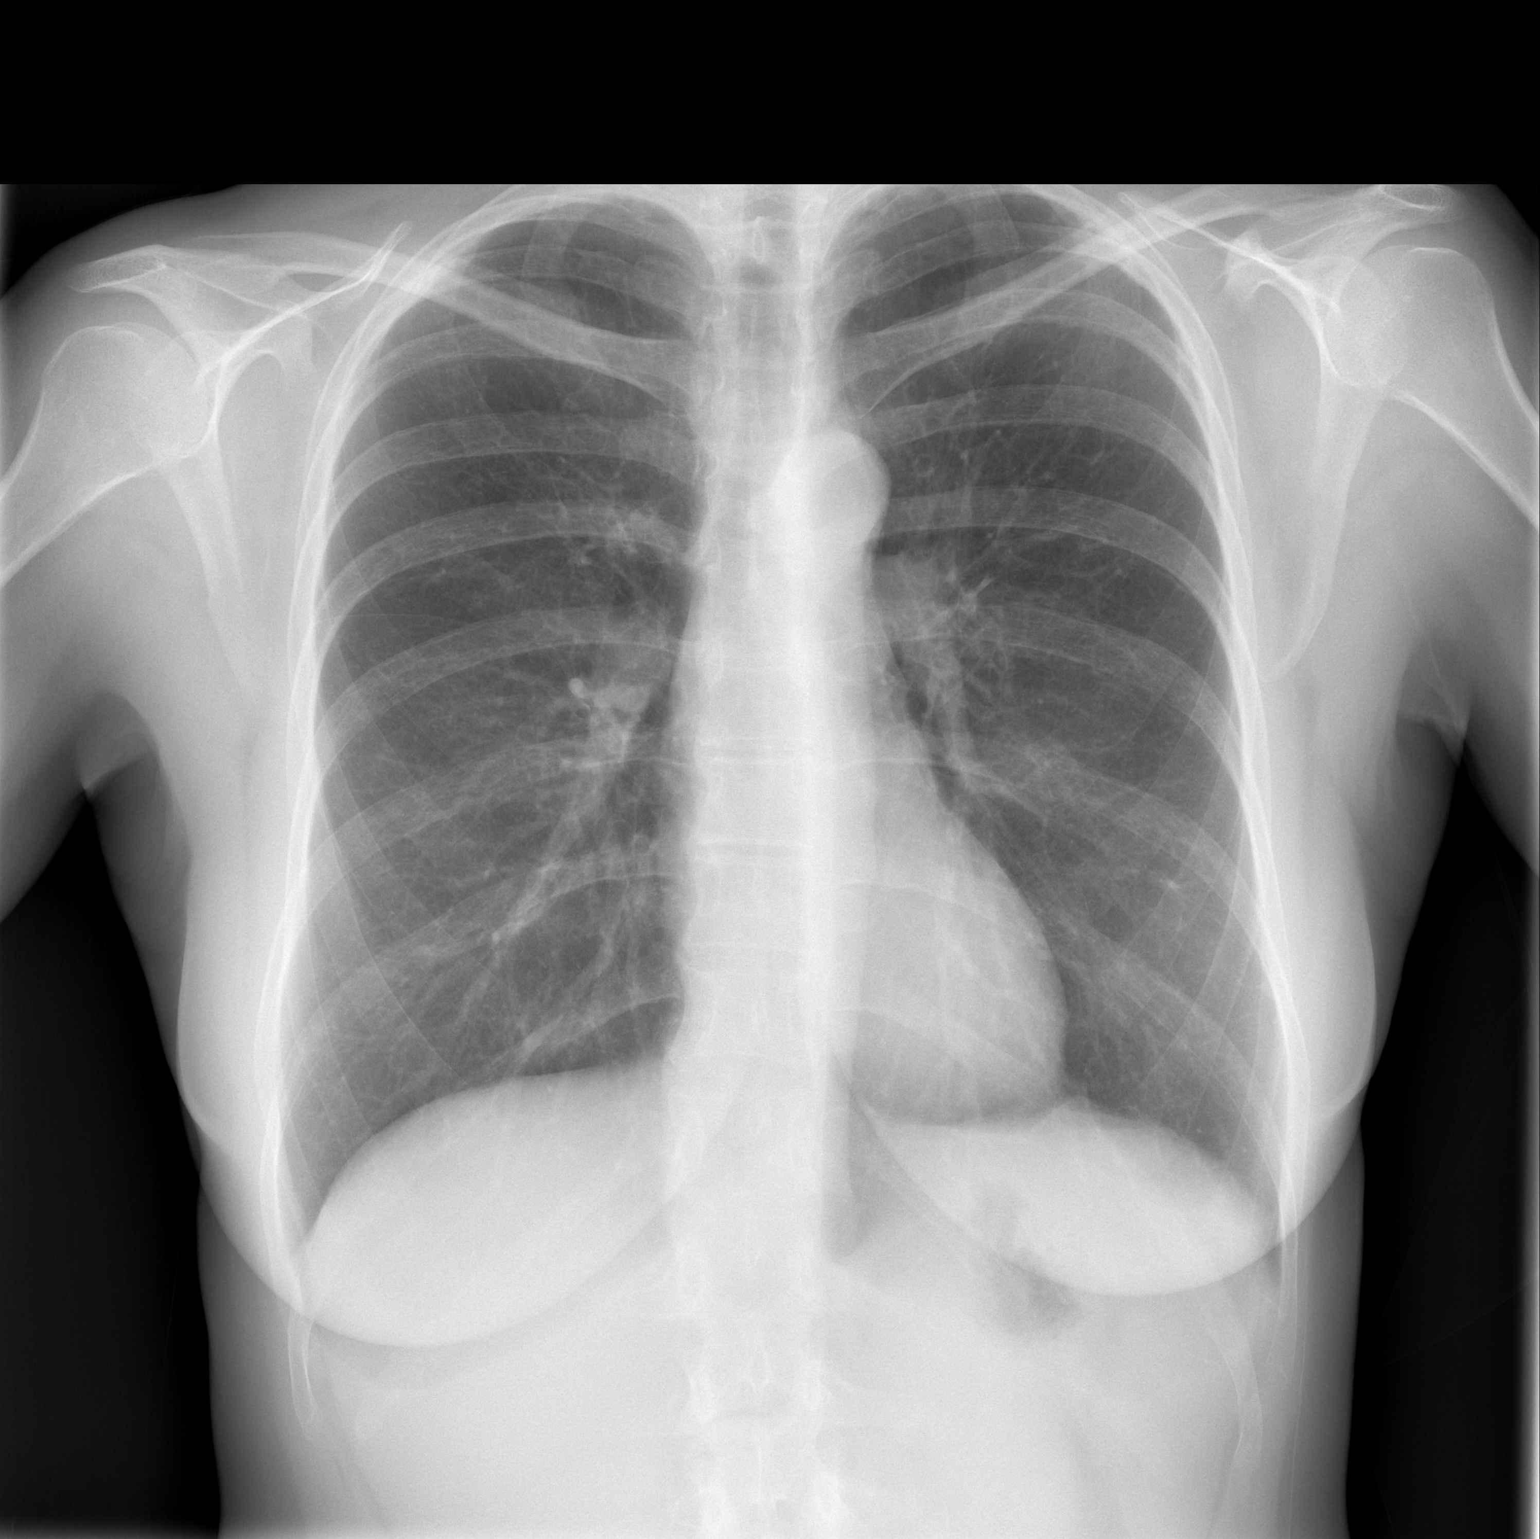

[w chest lat]
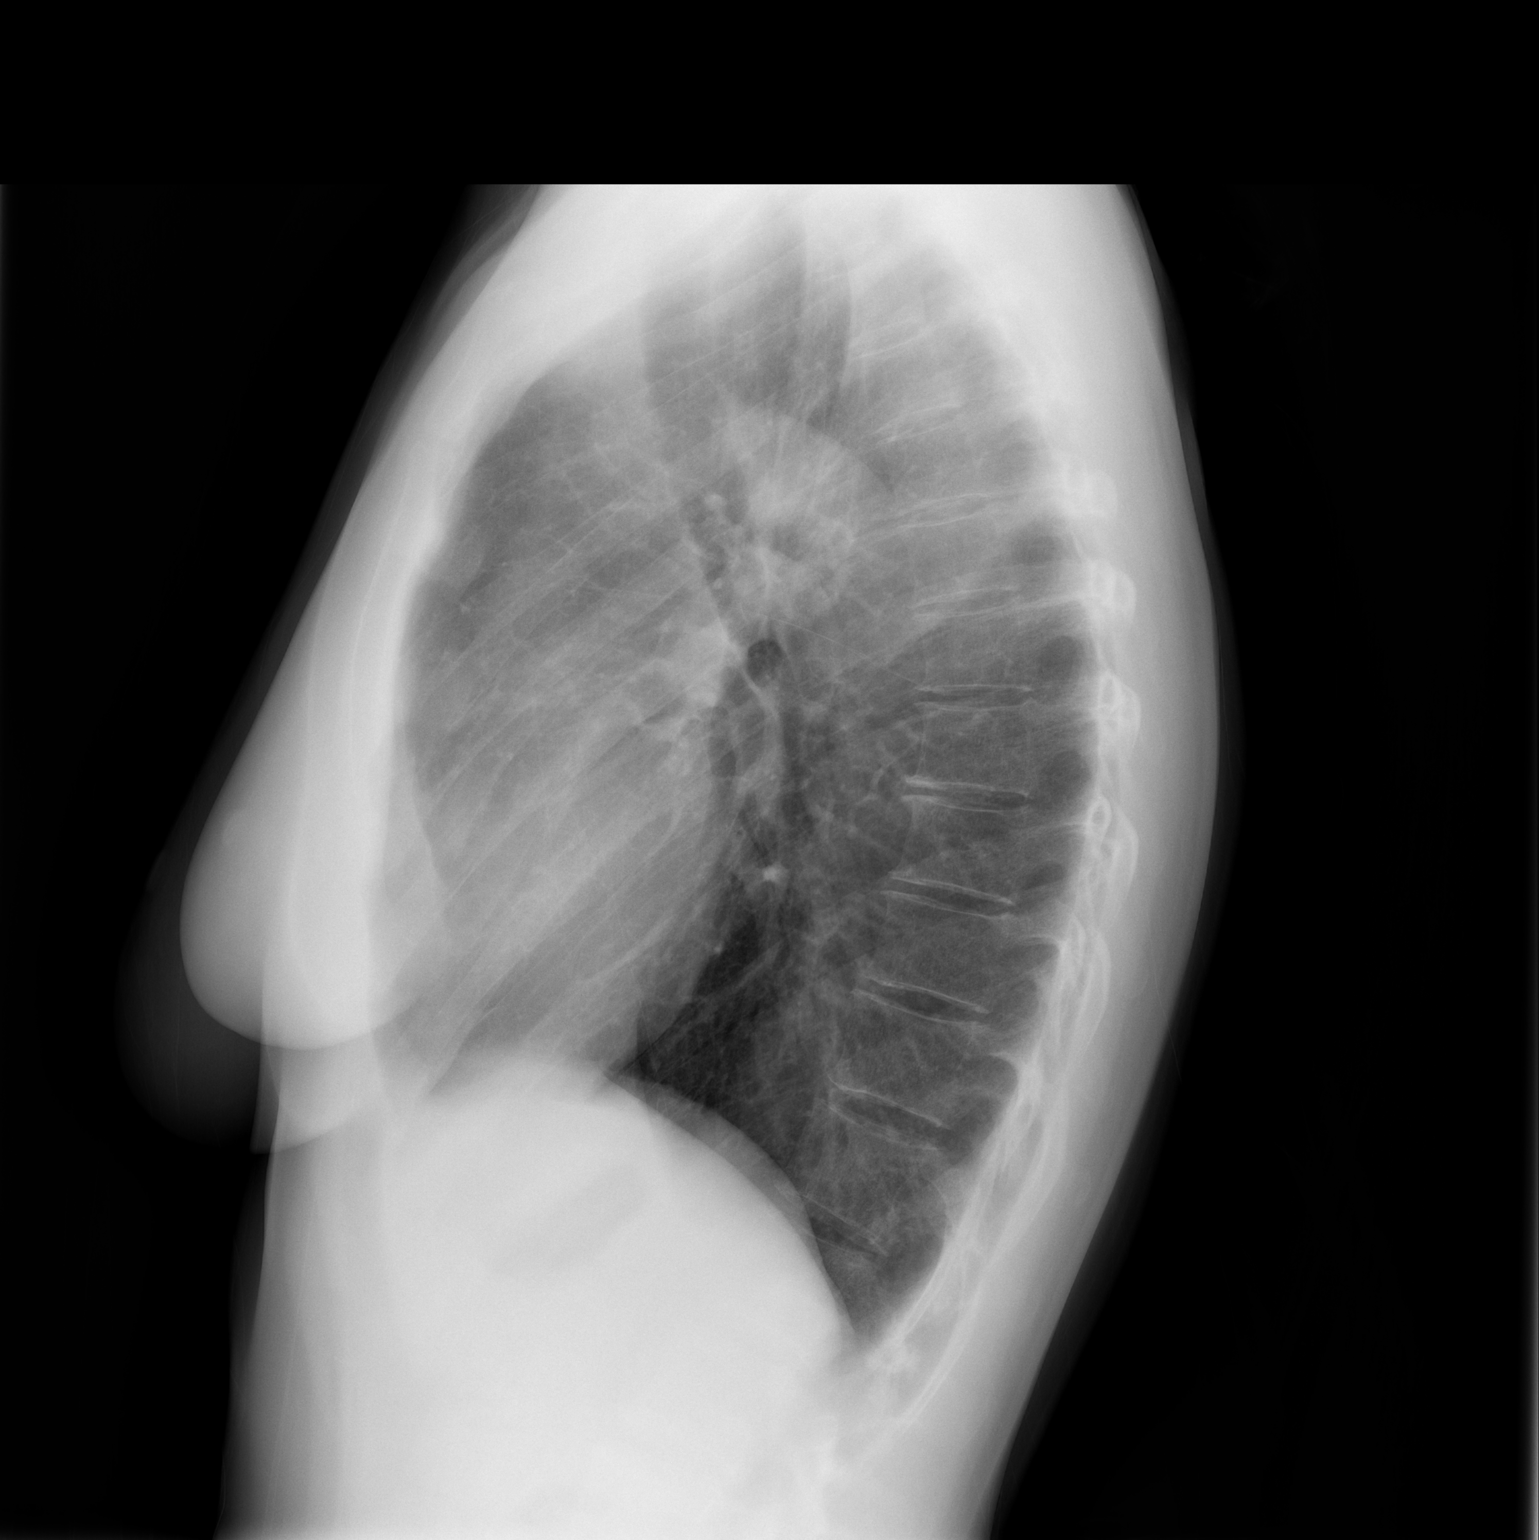

[2 of 2 positions shown; findings below may reference images not displayed]

FINDINGS: The heart size and mediastinal contours are within normal limits.
Both lungs are clear. The visualized skeletal structures are
unremarkable.
IMPRESSION: Normal two-view chest x-ray

## 2022-04-16 ENCOUNTER — Other Ambulatory Visit: Payer: Self-pay | Admitting: Gastroenterology

## 2022-04-16 DIAGNOSIS — R1310 Dysphagia, unspecified: Secondary | ICD-10-CM

## 2022-04-17 ENCOUNTER — Ambulatory Visit
Admission: RE | Admit: 2022-04-17 | Discharge: 2022-04-17 | Disposition: A | Discharge status: Home / Self Care | Payer: BC Managed Care – PPO | Source: Ambulatory Visit | Attending: Gastroenterology | Admitting: Gastroenterology

## 2022-04-17 DIAGNOSIS — R1310 Dysphagia, unspecified: Secondary | ICD-10-CM

## 2022-05-02 ENCOUNTER — Other Ambulatory Visit: Payer: Self-pay | Admitting: Physician Assistant

## 2022-05-02 ENCOUNTER — Ambulatory Visit
Admission: RE | Admit: 2022-05-02 | Discharge: 2022-05-02 | Disposition: A | Discharge status: Home / Self Care | Payer: BC Managed Care – PPO | Source: Ambulatory Visit | Attending: Physician Assistant | Admitting: Physician Assistant

## 2022-05-02 DIAGNOSIS — R0789 Other chest pain: Secondary | ICD-10-CM

## 2023-04-30 ENCOUNTER — Other Ambulatory Visit: Payer: Self-pay | Admitting: Obstetrics and Gynecology

## 2023-04-30 DIAGNOSIS — R102 Pelvic and perineal pain: Secondary | ICD-10-CM

## 2023-06-09 ENCOUNTER — Other Ambulatory Visit: Payer: BC Managed Care – PPO

## 2024-04-15 ENCOUNTER — Ambulatory Visit (INDEPENDENT_AMBULATORY_CARE_PROVIDER_SITE_OTHER): Payer: BC Managed Care – PPO | Admitting: Otolaryngology
